# Patient Record
Sex: Male | Born: 1995 | Race: White | Hispanic: No | Marital: Married | Smoking: Never smoker
Health system: Southern US, Community
[De-identification: ages and names within clinical notes are randomized; demographics above are authoritative.]

## PROBLEM LIST (undated history)

## (undated) DIAGNOSIS — I319 Disease of pericardium, unspecified: Secondary | ICD-10-CM

## (undated) HISTORY — PX: CARDIAC SURGERY: SHX584

---
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
## 2013-01-01 DIAGNOSIS — Z954 Presence of other heart-valve replacement: Secondary | ICD-10-CM | POA: Insufficient documentation

## 2013-01-01 DIAGNOSIS — Q231 Congenital insufficiency of aortic valve: Secondary | ICD-10-CM | POA: Insufficient documentation

## 2013-01-12 ENCOUNTER — Encounter (HOSPITAL_BASED_OUTPATIENT_CLINIC_OR_DEPARTMENT_OTHER): Payer: Self-pay | Admitting: *Deleted

## 2013-01-12 ENCOUNTER — Emergency Department (HOSPITAL_BASED_OUTPATIENT_CLINIC_OR_DEPARTMENT_OTHER)
Admission: EM | Admit: 2013-01-12 | Discharge: 2013-01-12 | Disposition: A | Discharge status: Home / Self Care | Payer: BC Managed Care – PPO | Attending: Emergency Medicine | Admitting: Emergency Medicine

## 2013-01-12 DIAGNOSIS — Y929 Unspecified place or not applicable: Secondary | ICD-10-CM | POA: Insufficient documentation

## 2013-01-12 DIAGNOSIS — Z7982 Long term (current) use of aspirin: Secondary | ICD-10-CM | POA: Insufficient documentation

## 2013-01-12 DIAGNOSIS — Y9389 Activity, other specified: Secondary | ICD-10-CM | POA: Insufficient documentation

## 2013-01-12 DIAGNOSIS — Z8679 Personal history of other diseases of the circulatory system: Secondary | ICD-10-CM | POA: Insufficient documentation

## 2013-01-12 DIAGNOSIS — Z9889 Other specified postprocedural states: Secondary | ICD-10-CM | POA: Insufficient documentation

## 2013-01-12 DIAGNOSIS — S61219A Laceration without foreign body of unspecified finger without damage to nail, initial encounter: Secondary | ICD-10-CM

## 2013-01-12 DIAGNOSIS — W260XXA Contact with knife, initial encounter: Secondary | ICD-10-CM | POA: Insufficient documentation

## 2013-01-12 DIAGNOSIS — S61209A Unspecified open wound of unspecified finger without damage to nail, initial encounter: Secondary | ICD-10-CM | POA: Insufficient documentation

## 2013-01-12 HISTORY — DX: Disease of pericardium, unspecified: I31.9

## 2013-01-12 NOTE — ED Provider Notes (Signed)
History     CSN: [id number]  Arrival date & time 01/12/13  0112   First MD Initiated Contact with Patient 01/12/13 0128      Chief Complaint  Patient presents with  . Finger Injury    (Consider location/radiation/quality/duration/timing/severity/associated sxs/prior treatment) HPI This is a 17 year old male who was cutting the insulation off of a table this morning when the knife slipped, striking the pad of his left index finger. There is a laceration to the pad of the left index finger with moderate bleeding. There is mild to moderate pain associated with it. There is no functional deficit but there is numbness distal to the wound. He denies other injury. His tetanus shots are up-to-date.  Past Medical History  Diagnosis Date  . Pericarditis     Past Surgical History  Procedure Laterality Date  . Cardiac surgery      History reviewed. No pertinent family history.  History  Substance Use Topics  . Smoking status: Not on file  . Smokeless tobacco: Not on file  . Alcohol Use: No      Review of Systems  All other systems reviewed and are negative.    Allergies  Ace inhibitors  Home Medications   Current Outpatient Rx  Name  Route  Sig  Dispense  Refill  . aspirin 81 MG tablet   Oral   Take 81 mg by mouth daily.         . cephALEXin (KEFLEX) 500 MG capsule   Oral   Take 500 mg by mouth 2 (two) times daily.           BP 114/67  Pulse 97  Temp(Src) 98.2 F (36.8 C) (Oral)  Resp 18  Ht 6\' 1"  (1.854 m)  Wt 175 lb (79.379 kg)  BMI 23.09 kg/m2  SpO2 100%  Physical Exam General: Well-developed, well-nourished male in no acute distress; appearance consistent with age of record HENT: normocephalic, atraumatic Eyes: Normal appearance Neck: supple Heart: regular rate and rhythm Lungs: Normal respiratory effort and excursion Abdomen: soft; nondistended Extremities: No deformity; full range of motion; laceration to pad of the left index finger, distal  capillary refill brisk, distal sensation decreased Neurologic: Awake, alert and oriented; motor function intact in all extremities and symmetric; no facial droop Skin: Warm and dry Psychiatric: Normal mood and affect    ED Course  Procedures (including critical care time)  LACERATION REPAIR Performed by: Lakeria Starkman L Authorized by: Hanley Seamen Consent: Verbal consent obtained. Risks and benefits: risks, benefits and alternatives were discussed Consent given by: patient Patient identity confirmed: provided demographic data Prepped and Draped in normal sterile fashion Wound explored  Laceration Location: Left index finger pad  Laceration Length: 2 cm  No Foreign Bodies seen or palpated  Anesthesia: local infiltration  Local anesthetic: lidocaine 2 % without epinephrine  Anesthetic total: 1 ml  Irrigation method: syringe Amount of cleaning: standard  Skin closure: 4-0 nylon   Number of sutures: 5  Technique: Simple interrupted   Patient tolerance: Patient tolerated the procedure well with no immediate complications.    MDM          Hanley Seamen, MD 01/12/13 [phone]

## 2013-01-12 NOTE — ED Notes (Signed)
Cut to right finger from knife, tetanus UTD

## 2013-07-09 ENCOUNTER — Encounter: Payer: Self-pay | Admitting: Internal Medicine

## 2014-10-14 ENCOUNTER — Ambulatory Visit (INDEPENDENT_AMBULATORY_CARE_PROVIDER_SITE_OTHER): Payer: BLUE CROSS/BLUE SHIELD | Admitting: Internal Medicine

## 2014-10-14 ENCOUNTER — Ambulatory Visit: Payer: Self-pay | Admitting: Physician Assistant

## 2014-10-14 ENCOUNTER — Encounter: Payer: Self-pay | Admitting: Internal Medicine

## 2014-10-14 VITALS — BP 118/84 | HR 94 | Temp 98.2°F | Resp 16 | Ht 73.0 in | Wt 150.0 lb

## 2014-10-14 DIAGNOSIS — R7309 Other abnormal glucose: Secondary | ICD-10-CM

## 2014-10-14 DIAGNOSIS — R7303 Prediabetes: Secondary | ICD-10-CM

## 2014-10-14 DIAGNOSIS — E559 Vitamin D deficiency, unspecified: Secondary | ICD-10-CM | POA: Insufficient documentation

## 2014-10-14 DIAGNOSIS — Z79899 Other long term (current) drug therapy: Secondary | ICD-10-CM | POA: Insufficient documentation

## 2014-10-14 DIAGNOSIS — R5383 Other fatigue: Secondary | ICD-10-CM

## 2014-10-14 DIAGNOSIS — J309 Allergic rhinitis, unspecified: Secondary | ICD-10-CM | POA: Insufficient documentation

## 2014-10-14 DIAGNOSIS — Z954 Presence of other heart-valve replacement: Secondary | ICD-10-CM

## 2014-10-14 LAB — CBC WITH DIFFERENTIAL/PLATELET
BASOS ABS: 0 10*3/uL (ref 0.0–0.1)
Basophils Relative: 0 % (ref 0–1)
EOS PCT: 0 % (ref 0–5)
Eosinophils Absolute: 0 10*3/uL (ref 0.0–0.7)
HCT: 41.2 % (ref 39.0–52.0)
Hemoglobin: 14 g/dL (ref 13.0–17.0)
Lymphocytes Relative: 26 % (ref 12–46)
Lymphs Abs: 2 10*3/uL (ref 0.7–4.0)
MCH: 28.8 pg (ref 26.0–34.0)
MCHC: 34 g/dL (ref 30.0–36.0)
MCV: 84.8 fL (ref 78.0–100.0)
MONO ABS: 0.6 10*3/uL (ref 0.1–1.0)
MPV: 8.8 fL (ref 8.6–12.4)
Monocytes Relative: 8 % (ref 3–12)
NEUTROS ABS: 5 10*3/uL (ref 1.7–7.7)
Neutrophils Relative %: 66 % (ref 43–77)
PLATELETS: 278 10*3/uL (ref 150–400)
RBC: 4.86 MIL/uL (ref 4.22–5.81)
RDW: 13.9 % (ref 11.5–15.5)
WBC: 7.5 10*3/uL (ref 4.0–10.5)

## 2014-10-14 NOTE — Patient Instructions (Signed)
Preventive Care for Adults A healthy lifestyle and preventive care can promote health and wellness. Preventive health guidelines for men include the following key practices:  A routine yearly physical is a good way to check with your health care provider about your health and preventative screening. It is a chance to share any concerns and updates on your health and to receive a thorough exam.  Visit your dentist for a routine exam and preventative care every 6 months. Brush your teeth twice a day and floss once a day. Good oral hygiene prevents tooth decay and gum disease.  The frequency of eye exams is based on your age, health, family medical history, use of contact lenses, and other factors. Follow your health care provider's recommendations for frequency of eye exams.  Eat a healthy diet. Foods such as vegetables, fruits, whole grains, low-fat dairy products, and lean protein foods contain the nutrients you need without too many calories. Decrease your intake of foods high in solid fats, added sugars, and salt. Eat the right amount of calories for you.Get information about a proper diet from your health care provider, if necessary.  Regular physical exercise is one of the most important things you can do for your health. Most adults should get at least 150 minutes of moderate-intensity exercise (any activity that increases your heart rate and causes you to sweat) each week. In addition, most adults need muscle-strengthening exercises on 2 or more days a week.  Maintain a healthy weight. The body mass index (BMI) is a screening tool to identify possible weight problems. It provides an estimate of body fat based on height and weight. Your health care provider can find your BMI and can help you achieve or maintain a healthy weight.For adults 20 years and older:  A BMI below 18.5 is considered underweight.  A BMI of 18.5 to 24.9 is normal.  A BMI of 25 to 29.9 is considered overweight.  A BMI  of 30 and above is considered obese.  Maintain normal blood lipids and cholesterol levels by exercising and minimizing your intake of saturated fat. Eat a balanced diet with plenty of fruit and vegetables. Blood tests for lipids and cholesterol should begin at age 50 and be repeated every 5 years. If your lipid or cholesterol levels are high, you are over 50, or you are at high risk for heart disease, you may need your cholesterol levels checked more frequently.Ongoing high lipid and cholesterol levels should be treated with medicines if diet and exercise are not working.  If you smoke, find out from your health care provider how to quit. If you do not use tobacco, do not start.  Lung cancer screening is recommended for adults aged 73-80 years who are at high risk for developing lung cancer because of a history of smoking. A yearly low-dose CT scan of the lungs is recommended for people who have at least a 30-pack-year history of smoking and are a current smoker or have quit within the past 15 years. A pack year of smoking is smoking an average of 1 pack of cigarettes a day for 1 year (for example: 1 pack a day for 30 years or 2 packs a day for 15 years). Yearly screening should continue until the smoker has stopped smoking for at least 15 years. Yearly screening should be stopped for people who develop a health problem that would prevent them from having lung cancer treatment.  If you choose to drink alcohol, do not have more than  2 drinks per day. One drink is considered to be 12 ounces (355 mL) of beer, 5 ounces (148 mL) of wine, or 1.5 ounces (44 mL) of liquor.  High blood pressure causes heart disease and increases the risk of stroke. Your blood pressure should be checked. Ongoing high blood pressure should be treated with medicines, if weight loss and exercise are not effective.  If you are 45-79 years old, ask your health care provider if you should take aspirin to prevent heart  disease.  Diabetes screening involves taking a blood sample to check your fasting blood sugar level. Testing should be considered at a younger age or be carried out more frequently if you are overweight and have at least 1 risk factor for diabetes.  Colorectal cancer can be detected and often prevented. Most routine colorectal cancer screening begins at the age of 50 and continues through age 75. However, your health care provider may recommend screening at an earlier age if you have risk factors for colon cancer. On a yearly basis, your health care provider may provide home test kits to check for hidden blood in the stool. Use of a small camera at the end of a tube to directly examine the colon (sigmoidoscopy or colonoscopy) can detect the earliest forms of colorectal cancer. Talk to your health care provider about this at age 50, when routine screening begins. Direct exam of the colon should be repeated every 5-10 years through age 75, unless early forms of precancerous polyps or small growths are found.  Screening for abdominal aortic aneurysm (AAA)  are recommended for persons over age 50 who have history of hypertensionor who are current or former smokers.  Talk with your health care provider about prostate cancer screening.  Testicular cancer screening is recommended for adult males. Screening includes self-exam, a health care provider exam, and other screening tests. Consult with your health care provider about any symptoms you have or any concerns you have about testicular cancer.  Use sunscreen. Apply sunscreen liberally and repeatedly throughout the day. You should seek shade when your shadow is shorter than you. Protect yourself by wearing long sleeves, pants, a wide-brimmed hat, and sunglasses year round, whenever you are outdoors.  Once a month, do a whole-body skin exam, using a mirror to look at the skin on your back. Tell your health care provider about new moles, moles that have  irregular borders, moles that are larger than a pencil eraser, or moles that have changed in shape or color.  Stay current with required vaccines (immunizations).  Influenza vaccine. All adults should be immunized every year.  Tetanus, diphtheria, and acellular pertussis (Td, Tdap) vaccine. An adult who has not previously received Tdap or who does not know his vaccine status should receive 1 dose of Tdap. This initial dose should be followed by tetanus and diphtheria toxoids (Td) booster doses every 10 years. Adults with an unknown or incomplete history of completing a 3-dose immunization series with Td-containing vaccines should begin or complete a primary immunization series including a Tdap dose. Adults should receive a Td booster every 10 years.  Zoster vaccine. One dose is recommended for adults aged 60 years or older unless certain conditions are present.    Pneumococcal 13-valent conjugate (PCV13) vaccine. When indicated, a person who is uncertain of his immunization history and has no record of immunization should receive the PCV13 vaccine. An adult aged 19 years or older who has certain medical conditions and has not been previously immunized should   receive 1 dose of PCV13 vaccine. This PCV13 should be followed with a dose of pneumococcal polysaccharide (PPSV23) vaccine. The PPSV23 vaccine dose should be obtained at least 8 weeks after the dose of PCV13 vaccine. An adult aged 19 years or older who has certain medical conditions and previously received 1 or more doses of PPSV23 vaccine should receive 1 dose of PCV13. The PCV13 vaccine dose should be obtained 1 or more years after the last PPSV23 vaccine dose.    Pneumococcal polysaccharide (PPSV23) vaccine. When PCV13 is also indicated, PCV13 should be obtained first. All adults aged 65 years and older should be immunized. An adult younger than age 65 years who has certain medical conditions should be immunized. Any person who resides in a  nursing home or long-term care facility should be immunized. An adult smoker should be immunized. People with an immunocompromised condition and certain other conditions should receive both PCV13 and PPSV23 vaccines. People with human immunodeficiency virus (HIV) infection should be immunized as soon as possible after diagnosis. Immunization during chemotherapy or radiation therapy should be avoided. Routine use of PPSV23 vaccine is not recommended for American Indians, [place] Natives, or people younger than 65 years unless there are medical conditions that require PPSV23 vaccine. When indicated, people who have unknown immunization and have no record of immunization should receive PPSV23 vaccine. One-time revaccination 5 years after the first dose of PPSV23 is recommended for people aged 19-64 years who have chronic kidney failure, nephrotic syndrome, asplenia, or immunocompromised conditions. People who received 1-2 doses of PPSV23 before age 65 years should receive another dose of PPSV23 vaccine at age 65 years or later if at least 5 years have passed since the previous dose. Doses of PPSV23 are not needed for people immunized with PPSV23 at or after age 65 years.  Hepatitis A vaccine. Adults who wish to be protected from this disease, have certain high-risk conditions, work with hepatitis A-infected animals, work in hepatitis A research labs, or travel to or work in countries with a high rate of hepatitis A should be immunized. Adults who were previously unvaccinated and who anticipate close contact with an international adoptee during the first 60 days after arrival in the [place] from a country with a high rate of hepatitis A should be immunized.  Hepatitis B vaccine. Adults should be immunized if they wish to be protected from this disease, have certain high-risk conditions, may be exposed to blood or other infectious body fluids, are household contacts or sex partners of hepatitis B positive  people, are clients or workers in certain care facilities, or travel to or work in countries with a high rate of hepatitis B.  Preventive Service / Frequency  Ages 19 to 39  Blood pressure check.  Lipid and cholesterol check.  Hepatitis C blood test.** / For any individual with known risks for hepatitis C.  Skin self-exam. / Monthly.  Influenza vaccine. / Every year.  Tetanus, diphtheria, and acellular pertussis (Tdap, Td) vaccine.** / Consult your health care provider. 1 dose of Td every 10 years.  HPV vaccine. / 3 doses over 6 months, if 26 or younger.  Measles, mumps, rubella (MMR) vaccine.** / You need at least 1 dose of MMR if you were born in 1957 or later. You may also need a second dose.  Pneumococcal 13-valent conjugate (PCV13) vaccine.** / Consult your health care provider.  Pneumococcal polysaccharide (PPSV23) vaccine.** / 1 to 2 doses if you smoke cigarettes or if you have certain   conditions.  Meningococcal vaccine.** / 1 dose if you are age 19 to 21 years and a first-year college student living in a residence hall, or have one of several medical conditions. You may also need additional booster doses.  Hepatitis A vaccine.** / Consult your health care provider.  Hepatitis B vaccine.** / Consult your health care provider. 

## 2014-10-14 NOTE — Progress Notes (Signed)
Patient ID: Bill Sullivan, male   DOB: [birth year], 19 y.o.   MRN: [id number]    Annual Screening Comprehensive Examination   This very nice 19 y.o.male presents for complete physical.  Patient has no major health issues.  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  He is followed by a cardiologist and reports no major problems other than a dripping nose. He does report that he has had some dripping in her nose and also some post nasal drip.  He does take a daily zyrtec which helps some but does not completely take care of it.  He feels that it is exacerbated with travel.  Finally, patient has history of Vitamin D Deficiency and has not been on any supplementation.  He has no recent vitamin D supplementation.       Current Outpatient Prescriptions on File Prior to Visit  Medication Sig Dispense Refill  . aspirin 81 MG tablet Take 81 mg by mouth daily.     No current facility-administered medications on file prior to visit.    Allergies  Allergen Reactions  . Ace Inhibitors     Past Medical History  Diagnosis Date  . Pericarditis      There is no immunization history on file for this patient.  Past Surgical History  Procedure Laterality Date  . Cardiac surgery      No family history on file.  History   Social History  . Marital Status: Married    Spouse Name: N/A  . Number of Children: N/A  . Years of Education: N/A   Occupational History  . Not on file.   Social History Main Topics  . Smoking status: Never Smoker   . Smokeless tobacco: Not on file  . Alcohol Use: No  . Drug Use: No  . Sexual Activity: Not on file   Other Topics Concern  . Not on file   Social History Narrative    ROS Constitutional: Denies fever, chills, weight loss/gain, headaches, insomnia, fatigue, night sweats, and change in appetite. Eyes: Denies redness, blurred vision, diplopia, discharge, itchy, watery eyes.  ENT: Denies discharge, congestion, post  nasal drip, epistaxis, sore throat, earache, hearing loss, dental pain, Tinnitus, Vertigo, Sinus pain, snoring.  Cardio: Denies chest pain, palpitations, irregular heartbeat, syncope, dyspnea, diaphoresis, orthopnea, PND, claudication, edema Respiratory: denies cough, dyspnea, DOE, pleurisy, hoarseness, laryngitis, wheezing.  Gastrointestinal: Denies dysphagia, heartburn, reflux, water brash, pain, cramps, nausea, vomiting, bloating, diarrhea, constipation, hematemesis, melena, hematochezia, jaundice, hemorrhoids Genitourinary: Denies dysuria, frequency, urgency, nocturia, hesitancy, discharge, hematuria, flank pain Breast: Breast lumps, nipple discharge, bleeding.  Musculoskeletal: Denies arthralgia, myalgia, stiffness, Jt. Swelling, pain, limp, and strain/sprain. Skin: Denies puritis, rash, hives, warts, acne, eczema, changing in skin lesion Neuro: Weakness, tremor, incoordination, spasms, paresthesia, pain Psychiatric: Denies confusion, memory loss, sensory loss. Endocrine: Denies change in weight, skin, hair change, nocturia, and paresthesia, diabetic polys, visual blurring, hyper /hypo glycemic episodes.  Heme/Lymph: No excessive bleeding, bruising, enlarged lymph nodes.   Physical Exam  BP 118/84 mmHg  Pulse 94  Temp(Src) 98.2 F (36.8 C) (Temporal)  Resp 16  Ht 6\' 1"  (1.854 m)  Wt 150 lb (68.04 kg)  BMI 19.79 kg/m2  General Appearance: Well nourished and in no apparent distress. Eyes: PERRLA, EOMs, conjunctiva no swelling or erythema, normal fundi and vessels. Sinuses: No frontal/maxillary tenderness ENT/Mouth: EACs patent / TMs  nl. Nares clear without erythema, swelling, mucoid exudates. Oral hygiene is good. No erythema, swelling, or exudate.  Tongue normal, non-obstructing. Tonsils not swollen or erythematous. Hearing normal.  Neck: Supple, thyroid normal. No bruits, nodes or JVD. Respiratory: Respiratory effort normal.  BS equal and clear bilateral without rales, rhonci,  wheezing or stridor. Cardio: III/VI murmur heard best over the left sternal border with no RGs.  Regular rate and rhythm Chest: symmetric with normal excursions and percussion. Abdomen: Flat, soft, with bowl sounds. Nontender, no guarding, rebound, hernias, masses, or organomegaly.  Lymphatics: Non tender without lymphadenopathy.  Genitourinary: No inguinal hernias Musculoskeletal: Full ROM all peripheral extremities, joint stability, 5/5 strength, and normal gait. Skin: Warm and dry without rashes, lesions, cyanosis, clubbing or  ecchymosis.  Neuro: Cranial nerves intact, reflexes equal bilaterally. Normal muscle tone, no cerebellar symptoms. Sensation intact.  Pysch: Awake and oriented X 3, normal affect, Insight and Judgment appropriate.   Assessment and Plan     Continue prudent diet as discussed, weight control, regular exercise, and medications. Routine screening labs and tests as requested with regular follow-up as recommended.   1. Allergic rhinitis, unspecified allergic rhinitis type -given sample of nasonex -cont OTC zyrtec  2. Hx of heart valve replacement with tissue graft -f/u with his cardiologist - Lipid panel  3. Vitamin D deficiency -check Vit D level - Vit D  25 hydroxy (rtn osteoporosis monitoring)  4. Other fatigue - check for RBC destruction with non-native valve - Vitamin B12 - Iron and TIBC - TSH  5. Medication management  - CBC with Differential/Platelet - BASIC METABOLIC PANEL WITH GFR - Hepatic function panel - Magnesium  6. Prediabetes -r/o prediabetes given heart history - Hemoglobin A1c - Insulin, fasting

## 2014-10-15 LAB — IRON AND TIBC
%SAT: 16 % — ABNORMAL LOW (ref 20–55)
IRON: 50 ug/dL (ref 42–165)
TIBC: 319 ug/dL (ref 215–435)
UIBC: 269 ug/dL (ref 125–400)

## 2014-10-15 LAB — BASIC METABOLIC PANEL WITH GFR
BUN: 10 mg/dL (ref 6–23)
CALCIUM: 9.4 mg/dL (ref 8.4–10.5)
CO2: 29 mEq/L (ref 19–32)
CREATININE: 0.83 mg/dL (ref 0.50–1.35)
Chloride: 105 mEq/L (ref 96–112)
GFR, Est African American: >89 mL/min
GFR, Est Non African American: >89 mL/min
Glucose, Bld: 82 mg/dL (ref 70–99)
Potassium: 4.2 mEq/L (ref 3.5–5.3)
Sodium: 141 mEq/L (ref 135–145)

## 2014-10-15 LAB — LIPID PANEL
CHOL/HDL RATIO: 2.6 ratio
CHOLESTEROL: 134 mg/dL (ref 0–200)
HDL: 51 mg/dL (ref >=40)
LDL Cholesterol: 72 mg/dL (ref 0–99)
Triglycerides: 56 mg/dL (ref <150)
VLDL: 11 mg/dL (ref 0–40)

## 2014-10-15 LAB — HEPATIC FUNCTION PANEL
ALT: 12 U/L (ref 0–53)
AST: 16 U/L (ref 0–37)
Albumin: 4.5 g/dL (ref 3.5–5.2)
Alkaline Phosphatase: 60 U/L (ref 39–117)
BILIRUBIN INDIRECT: 0.2 mg/dL (ref 0.2–1.2)
Bilirubin, Direct: 0.1 mg/dL (ref 0.0–0.3)
Total Bilirubin: 0.3 mg/dL (ref 0.2–1.2)
Total Protein: 6.7 g/dL (ref 6.0–8.3)

## 2014-10-15 LAB — INSULIN, FASTING: INSULIN FASTING, SERUM: 3.3 u[IU]/mL (ref 2.0–19.6)

## 2014-10-15 LAB — HEMOGLOBIN A1C
HEMOGLOBIN A1C: 5.5 % (ref <5.7)
Mean Plasma Glucose: 111 mg/dL (ref <117)

## 2014-10-15 LAB — MAGNESIUM: Magnesium: 1.8 mg/dL (ref 1.5–2.5)

## 2014-10-15 LAB — TSH: TSH: 0.558 u[IU]/mL (ref 0.350–4.500)

## 2014-10-15 LAB — VITAMIN D 25 HYDROXY (VIT D DEFICIENCY, FRACTURES): Vit D, 25-Hydroxy: 33 ng/mL (ref 30–100)

## 2014-10-15 LAB — VITAMIN B12: Vitamin B-12: 610 pg/mL (ref 211–911)

## 2016-04-16 DIAGNOSIS — I351 Nonrheumatic aortic (valve) insufficiency: Secondary | ICD-10-CM | POA: Insufficient documentation

## 2016-05-12 ENCOUNTER — Encounter: Payer: Self-pay | Admitting: Internal Medicine

## 2017-05-12 ENCOUNTER — Encounter: Payer: Self-pay | Admitting: Internal Medicine

## 2017-08-29 DIAGNOSIS — F1120 Opioid dependence, uncomplicated: Secondary | ICD-10-CM | POA: Insufficient documentation

## 2018-03-19 DIAGNOSIS — Z79891 Long term (current) use of opiate analgesic: Secondary | ICD-10-CM | POA: Insufficient documentation

## 2018-03-19 DIAGNOSIS — Z79899 Other long term (current) drug therapy: Secondary | ICD-10-CM | POA: Insufficient documentation

## 2018-03-19 DIAGNOSIS — Z5181 Encounter for therapeutic drug level monitoring: Secondary | ICD-10-CM | POA: Insufficient documentation

## 2019-11-28 ENCOUNTER — Encounter: Payer: Self-pay | Admitting: Internal Medicine

## 2019-12-17 ENCOUNTER — Encounter: Payer: Self-pay | Admitting: Physician Assistant

## 2019-12-18 MED ORDER — ACETAMINOPHEN 325 MG PO TABS
650.00 | ORAL_TABLET | ORAL | Status: DC
Start: ? — End: 2019-12-18

## 2019-12-18 MED ORDER — LACTATED RINGERS IV SOLN
10.00 | INTRAVENOUS | Status: DC
Start: ? — End: 2019-12-18

## 2019-12-18 MED ORDER — OXYCODONE HCL 5 MG PO TABS
7.50 | ORAL_TABLET | ORAL | Status: DC
Start: ? — End: 2019-12-18

## 2019-12-18 MED ORDER — OXYCODONE HCL 5 MG PO TABS
5.00 | ORAL_TABLET | ORAL | Status: DC
Start: ? — End: 2019-12-18
